# Patient Record
Sex: Female | Born: 1980 | Race: White | Hispanic: Yes
Health system: Southern US, Community
[De-identification: ages and names within clinical notes are randomized; demographics above are authoritative.]

---

## 2007-06-14 ENCOUNTER — Ambulatory Visit (HOSPITAL_COMMUNITY): Admission: RE | Admit: 2007-06-14 | Discharge: 2007-06-14 | Payer: Self-pay | Admitting: Obstetrics and Gynecology

## 2007-07-13 ENCOUNTER — Ambulatory Visit (HOSPITAL_COMMUNITY): Admission: RE | Admit: 2007-07-13 | Discharge: 2007-07-13 | Payer: Self-pay | Admitting: Obstetrics and Gynecology

## 2007-08-09 ENCOUNTER — Ambulatory Visit (HOSPITAL_COMMUNITY): Admission: RE | Admit: 2007-08-09 | Discharge: 2007-08-09 | Payer: Self-pay | Admitting: Obstetrics and Gynecology

## 2007-08-24 ENCOUNTER — Ambulatory Visit (HOSPITAL_COMMUNITY): Admission: RE | Admit: 2007-08-24 | Discharge: 2007-08-24 | Payer: Self-pay | Admitting: Obstetrics and Gynecology

## 2007-09-07 ENCOUNTER — Ambulatory Visit (HOSPITAL_COMMUNITY): Admission: RE | Admit: 2007-09-07 | Discharge: 2007-09-07 | Payer: Self-pay | Admitting: Obstetrics and Gynecology

## 2007-09-26 ENCOUNTER — Ambulatory Visit (HOSPITAL_COMMUNITY): Admission: RE | Admit: 2007-09-26 | Discharge: 2007-09-26 | Payer: Self-pay | Admitting: Obstetrics and Gynecology

## 2007-10-04 ENCOUNTER — Ambulatory Visit (HOSPITAL_COMMUNITY): Admission: RE | Admit: 2007-10-04 | Discharge: 2007-10-04 | Payer: Self-pay | Admitting: Obstetrics and Gynecology

## 2007-10-13 ENCOUNTER — Ambulatory Visit (HOSPITAL_COMMUNITY): Admission: RE | Admit: 2007-10-13 | Discharge: 2007-10-13 | Payer: Self-pay | Admitting: Obstetrics and Gynecology

## 2008-04-25 ENCOUNTER — Ambulatory Visit: Payer: Self-pay | Admitting: Family Medicine

## 2008-04-25 LAB — CONVERTED CEMR LAB
Basophils Absolute: 0 10*3/uL (ref 0.0–0.1)
Eosinophils Absolute: 0.1 10*3/uL (ref 0.0–0.7)
Eosinophils Relative: 1 % (ref 0–5)
HCT: 36.7 % (ref 36.0–46.0)
Hemoglobin: 12.2 g/dL (ref 12.0–15.0)
Hepatitis B Surface Ag: NEGATIVE
Hgb A2 Quant: 2.9 % (ref 2.2–3.2)
Hgb A: 97.1 % (ref 96.8–97.8)
Lymphocytes Relative: 26 % (ref 12–46)
Lymphs Abs: 2.2 10*3/uL (ref 0.7–4.0)
MCV: 85.9 fL (ref 78.0–100.0)
Monocytes Absolute: 0.5 10*3/uL (ref 0.1–1.0)
Platelets: 386 10*3/uL (ref 150–400)
RDW: 14.5 % (ref 11.5–15.5)
Rh Type: POSITIVE

## 2008-04-29 ENCOUNTER — Encounter: Payer: Self-pay | Admitting: Family Medicine

## 2008-04-29 ENCOUNTER — Encounter: Payer: Self-pay | Admitting: Obstetrics & Gynecology

## 2008-04-29 ENCOUNTER — Ambulatory Visit: Payer: Self-pay | Admitting: Obstetrics & Gynecology

## 2008-04-29 ENCOUNTER — Ambulatory Visit (HOSPITAL_COMMUNITY): Admission: RE | Admit: 2008-04-29 | Discharge: 2008-04-29 | Payer: Self-pay | Admitting: Family Medicine

## 2008-04-29 LAB — CONVERTED CEMR LAB
Trich, Wet Prep: NONE SEEN
Yeast Wet Prep HPF POC: NONE SEEN

## 2008-05-06 ENCOUNTER — Ambulatory Visit (HOSPITAL_COMMUNITY): Admission: RE | Admit: 2008-05-06 | Discharge: 2008-05-06 | Payer: Self-pay | Admitting: Obstetrics & Gynecology

## 2008-05-06 ENCOUNTER — Ambulatory Visit: Payer: Self-pay | Admitting: Obstetrics & Gynecology

## 2008-05-16 ENCOUNTER — Ambulatory Visit: Payer: Self-pay | Admitting: Obstetrics & Gynecology

## 2008-05-23 ENCOUNTER — Ambulatory Visit: Payer: Self-pay | Admitting: Family Medicine

## 2008-05-30 ENCOUNTER — Ambulatory Visit: Payer: Self-pay | Admitting: Family Medicine

## 2008-06-07 ENCOUNTER — Ambulatory Visit: Payer: Self-pay | Admitting: Obstetrics & Gynecology

## 2008-06-13 ENCOUNTER — Ambulatory Visit (HOSPITAL_COMMUNITY): Admission: RE | Admit: 2008-06-13 | Discharge: 2008-06-13 | Payer: Self-pay | Admitting: Family Medicine

## 2008-06-13 ENCOUNTER — Ambulatory Visit: Payer: Self-pay | Admitting: Obstetrics & Gynecology

## 2008-06-21 ENCOUNTER — Ambulatory Visit: Payer: Self-pay | Admitting: Obstetrics & Gynecology

## 2008-06-28 ENCOUNTER — Ambulatory Visit: Payer: Self-pay | Admitting: Obstetrics & Gynecology

## 2008-06-29 ENCOUNTER — Encounter: Payer: Self-pay | Admitting: Family Medicine

## 2008-07-03 ENCOUNTER — Ambulatory Visit: Payer: Self-pay | Admitting: Physician Assistant

## 2008-07-03 ENCOUNTER — Inpatient Hospital Stay (HOSPITAL_COMMUNITY): Admission: AD | Admit: 2008-07-03 | Discharge: 2008-07-03 | Payer: Self-pay | Admitting: Obstetrics & Gynecology

## 2008-07-05 ENCOUNTER — Ambulatory Visit: Payer: Self-pay | Admitting: Obstetrics & Gynecology

## 2008-07-11 ENCOUNTER — Ambulatory Visit: Payer: Self-pay | Admitting: Obstetrics & Gynecology

## 2008-07-11 ENCOUNTER — Encounter: Payer: Self-pay | Admitting: Family Medicine

## 2008-07-19 ENCOUNTER — Ambulatory Visit: Payer: Self-pay | Admitting: Obstetrics & Gynecology

## 2008-07-25 ENCOUNTER — Ambulatory Visit: Payer: Self-pay | Admitting: Family Medicine

## 2008-07-25 ENCOUNTER — Encounter: Payer: Self-pay | Admitting: Family Medicine

## 2008-08-01 ENCOUNTER — Encounter: Payer: Self-pay | Admitting: Obstetrics & Gynecology

## 2008-08-01 ENCOUNTER — Ambulatory Visit: Payer: Self-pay | Admitting: Family Medicine

## 2008-08-08 ENCOUNTER — Encounter: Payer: Self-pay | Admitting: Obstetrics & Gynecology

## 2008-08-08 ENCOUNTER — Ambulatory Visit: Payer: Self-pay | Admitting: Family Medicine

## 2008-08-08 LAB — CONVERTED CEMR LAB
HCT: 33.6 % — ABNORMAL LOW (ref 36.0–46.0)
Hemoglobin: 11.5 g/dL — ABNORMAL LOW (ref 12.0–15.0)
MCHC: 34.2 g/dL (ref 30.0–36.0)
MCV: 82.6 fL (ref 78.0–100.0)
RBC: 4.07 M/uL (ref 3.87–5.11)
RDW: 13.6 % (ref 11.5–15.5)

## 2008-08-15 ENCOUNTER — Ambulatory Visit: Payer: Self-pay | Admitting: Obstetrics & Gynecology

## 2008-08-15 LAB — CONVERTED CEMR LAB

## 2008-08-24 ENCOUNTER — Inpatient Hospital Stay (HOSPITAL_COMMUNITY): Admission: AD | Admit: 2008-08-24 | Discharge: 2008-09-02 | Payer: Self-pay | Admitting: Family Medicine

## 2008-08-24 ENCOUNTER — Ambulatory Visit: Payer: Self-pay | Admitting: Obstetrics and Gynecology

## 2008-08-31 ENCOUNTER — Encounter: Payer: Self-pay | Admitting: Family Medicine

## 2009-03-21 ENCOUNTER — Emergency Department (HOSPITAL_COMMUNITY): Admission: EM | Admit: 2009-03-21 | Discharge: 2009-03-21 | Payer: Self-pay | Admitting: Emergency Medicine

## 2009-05-23 ENCOUNTER — Encounter: Admission: RE | Admit: 2009-05-23 | Discharge: 2009-05-23 | Payer: Self-pay | Admitting: Specialist

## 2009-12-15 ENCOUNTER — Inpatient Hospital Stay (HOSPITAL_COMMUNITY): Admission: AD | Admit: 2009-12-15 | Discharge: 2009-12-15 | Payer: Self-pay | Admitting: Obstetrics & Gynecology

## 2009-12-15 ENCOUNTER — Ambulatory Visit: Payer: Self-pay | Admitting: Obstetrics & Gynecology

## 2010-05-17 ENCOUNTER — Encounter: Payer: Self-pay | Admitting: Obstetrics and Gynecology

## 2010-05-18 ENCOUNTER — Encounter: Payer: Self-pay | Admitting: Obstetrics and Gynecology

## 2010-07-10 LAB — URINALYSIS, ROUTINE W REFLEX MICROSCOPIC
Bilirubin Urine: NEGATIVE
Leukocytes, UA: NEGATIVE
Nitrite: NEGATIVE
Specific Gravity, Urine: >1.03 — ABNORMAL HIGH (ref 1.005–1.030)
Urobilinogen, UA: 0.2 mg/dL (ref 0.0–1.0)
pH: 6 (ref 5.0–8.0)

## 2010-07-10 LAB — WET PREP, GENITAL: Trich, Wet Prep: NONE SEEN

## 2010-07-10 LAB — GC/CHLAMYDIA PROBE AMP, GENITAL: GC Probe Amp, Genital: NEGATIVE

## 2010-07-10 LAB — CBC
HCT: 33.9 % — ABNORMAL LOW (ref 36.0–46.0)
MCH: 29.3 pg (ref 26.0–34.0)
MCV: 86.5 fL (ref 78.0–100.0)
RDW: 13.8 % (ref 11.5–15.5)
WBC: 13.1 10*3/uL — ABNORMAL HIGH (ref 4.0–10.5)

## 2010-07-10 LAB — URINE MICROSCOPIC-ADD ON

## 2010-07-29 LAB — URINALYSIS, ROUTINE W REFLEX MICROSCOPIC
Bilirubin Urine: NEGATIVE
Glucose, UA: NEGATIVE mg/dL
Ketones, ur: NEGATIVE mg/dL
Leukocytes, UA: NEGATIVE
pH: 6 (ref 5.0–8.0)

## 2010-07-29 LAB — CBC
MCHC: 33.4 g/dL (ref 30.0–36.0)
MCV: 82.7 fL (ref 78.0–100.0)
Platelets: 412 10*3/uL — ABNORMAL HIGH (ref 150–400)
WBC: 10.9 10*3/uL — ABNORMAL HIGH (ref 4.0–10.5)

## 2010-07-29 LAB — URINE MICROSCOPIC-ADD ON

## 2010-07-29 LAB — COMPREHENSIVE METABOLIC PANEL
AST: 15 U/L (ref 0–37)
Albumin: 4.3 g/dL (ref 3.5–5.2)
Calcium: 9.6 mg/dL (ref 8.4–10.5)
Chloride: 112 mEq/L (ref 96–112)
Creatinine, Ser: 0.61 mg/dL (ref 0.4–1.2)
GFR calc Af Amer: >60 mL/min (ref >60)

## 2010-07-29 LAB — RAPID URINE DRUG SCREEN, HOSP PERFORMED
Cocaine: NOT DETECTED
Opiates: NOT DETECTED

## 2010-07-29 LAB — DIFFERENTIAL
Eosinophils Relative: 1 % (ref 0–5)
Lymphocytes Relative: 30 % (ref 12–46)
Lymphs Abs: 3.3 10*3/uL (ref 0.7–4.0)
Monocytes Absolute: 0.6 10*3/uL (ref 0.1–1.0)

## 2010-07-29 LAB — POCT PREGNANCY, URINE: Preg Test, Ur: NEGATIVE

## 2010-08-04 LAB — TYPE AND SCREEN: Antibody Screen: NEGATIVE

## 2010-08-04 LAB — CBC
HCT: 30.4 % — ABNORMAL LOW (ref 36.0–46.0)
HCT: 31.7 % — ABNORMAL LOW (ref 36.0–46.0)
Hemoglobin: 10.6 g/dL — ABNORMAL LOW (ref 12.0–15.0)
Hemoglobin: 11.1 g/dL — ABNORMAL LOW (ref 12.0–15.0)
Hemoglobin: 12 g/dL (ref 12.0–15.0)
Hemoglobin: 12.3 g/dL (ref 12.0–15.0)
MCHC: 34.2 g/dL (ref 30.0–36.0)
MCV: 87 fL (ref 78.0–100.0)
MCV: 88.1 fL (ref 78.0–100.0)
Platelets: 289 10*3/uL (ref 150–400)
Platelets: 292 10*3/uL (ref 150–400)
Platelets: 293 10*3/uL (ref 150–400)
RBC: 3.57 MIL/uL — ABNORMAL LOW (ref 3.87–5.11)
RBC: 3.98 MIL/uL (ref 3.87–5.11)
RBC: 4.08 MIL/uL (ref 3.87–5.11)
RDW: 13.2 % (ref 11.5–15.5)
WBC: 16 10*3/uL — ABNORMAL HIGH (ref 4.0–10.5)
WBC: 19 10*3/uL — ABNORMAL HIGH (ref 4.0–10.5)
WBC: 20.2 10*3/uL — ABNORMAL HIGH (ref 4.0–10.5)
WBC: 23.8 10*3/uL — ABNORMAL HIGH (ref 4.0–10.5)

## 2010-08-04 LAB — URINALYSIS, ROUTINE W REFLEX MICROSCOPIC
Bilirubin Urine: NEGATIVE
Glucose, UA: NEGATIVE mg/dL
Specific Gravity, Urine: 1.02 (ref 1.005–1.030)
Urobilinogen, UA: 0.2 mg/dL (ref 0.0–1.0)
pH: 6 (ref 5.0–8.0)

## 2010-08-04 LAB — DIFFERENTIAL
Eosinophils Relative: 1 % (ref 0–5)
Lymphocytes Relative: 15 % (ref 12–46)
Lymphs Abs: 1.8 10*3/uL (ref 0.7–4.0)
Lymphs Abs: 2.5 10*3/uL (ref 0.7–4.0)
Monocytes Relative: 5 % (ref 3–12)
Neutro Abs: 19.9 10*3/uL — ABNORMAL HIGH (ref 1.7–7.7)
Neutrophils Relative %: 87 % — ABNORMAL HIGH (ref 43–77)

## 2010-08-04 LAB — ABO/RH: ABO/RH(D): O POS

## 2010-08-04 LAB — WET PREP, GENITAL
Clue Cells Wet Prep HPF POC: NONE SEEN
Trich, Wet Prep: NONE SEEN

## 2010-08-04 LAB — URINE MICROSCOPIC-ADD ON

## 2010-08-05 LAB — POCT URINALYSIS DIP (DEVICE)
Bilirubin Urine: NEGATIVE
Bilirubin Urine: NEGATIVE
Glucose, UA: NEGATIVE mg/dL
Ketones, ur: NEGATIVE mg/dL
Nitrite: NEGATIVE
Nitrite: NEGATIVE
Protein, ur: 30 mg/dL — AB
Protein, ur: NEGATIVE mg/dL
Protein, ur: NEGATIVE mg/dL
Specific Gravity, Urine: 1.025 (ref 1.005–1.030)
Specific Gravity, Urine: 1.015 (ref 1.005–1.030)
Specific Gravity, Urine: 1.025 (ref 1.005–1.030)
Urobilinogen, UA: 0.2 mg/dL (ref 0.0–1.0)
Urobilinogen, UA: 0.2 mg/dL (ref 0.0–1.0)
Urobilinogen, UA: 1 mg/dL (ref 0.0–1.0)
pH: 6 (ref 5.0–8.0)
pH: 6.5 (ref 5.0–8.0)
pH: 6.5 (ref 5.0–8.0)
pH: 6.5 (ref 5.0–8.0)

## 2010-08-06 LAB — URINE MICROSCOPIC-ADD ON

## 2010-08-06 LAB — POCT URINALYSIS DIP (DEVICE)
Ketones, ur: NEGATIVE mg/dL
Nitrite: NEGATIVE
Nitrite: NEGATIVE
Protein, ur: NEGATIVE mg/dL
Protein, ur: NEGATIVE mg/dL
Urobilinogen, UA: 0.2 mg/dL (ref 0.0–1.0)
Urobilinogen, UA: 0.2 mg/dL (ref 0.0–1.0)
pH: 6.5 (ref 5.0–8.0)
pH: 7 (ref 5.0–8.0)

## 2010-08-06 LAB — URINE CULTURE: Colony Count: NO GROWTH

## 2010-08-06 LAB — URINALYSIS, ROUTINE W REFLEX MICROSCOPIC
Bilirubin Urine: NEGATIVE
Ketones, ur: NEGATIVE mg/dL
Nitrite: NEGATIVE
Protein, ur: NEGATIVE mg/dL
pH: 7.5 (ref 5.0–8.0)

## 2010-08-06 LAB — WET PREP, GENITAL
Trich, Wet Prep: NONE SEEN
Yeast Wet Prep HPF POC: NONE SEEN

## 2010-08-06 LAB — GC/CHLAMYDIA PROBE AMP, GENITAL: Chlamydia, DNA Probe: NEGATIVE

## 2010-08-10 LAB — CBC
HCT: 35.7 % — ABNORMAL LOW (ref 36.0–46.0)
Hemoglobin: 11.9 g/dL — ABNORMAL LOW (ref 12.0–15.0)
WBC: 9.2 10*3/uL (ref 4.0–10.5)

## 2010-08-10 LAB — POCT URINALYSIS DIP (DEVICE)
Bilirubin Urine: NEGATIVE
Ketones, ur: NEGATIVE mg/dL
Ketones, ur: NEGATIVE mg/dL
Protein, ur: NEGATIVE mg/dL
Specific Gravity, Urine: 1.025 (ref 1.005–1.030)
Specific Gravity, Urine: 1.02 (ref 1.005–1.030)
pH: 6 (ref 5.0–8.0)

## 2010-08-11 LAB — POCT URINALYSIS DIP (DEVICE)
Bilirubin Urine: NEGATIVE
Nitrite: NEGATIVE
Protein, ur: NEGATIVE mg/dL
Protein, ur: NEGATIVE mg/dL
Specific Gravity, Urine: 1.02 (ref 1.005–1.030)
Urobilinogen, UA: 0.2 mg/dL (ref 0.0–1.0)
Urobilinogen, UA: 0.2 mg/dL (ref 0.0–1.0)
pH: 6 (ref 5.0–8.0)
pH: 6 (ref 5.0–8.0)

## 2010-09-08 NOTE — Op Note (Signed)
[name], Cathy [name]          ACCOUNT NO.:  [id number]   MEDICAL RECORD NO.:  [id number]          PATIENT TYPE:  AMB   LOCATION:  SDC                           FACILITY:  [facility]   PHYSICIAN:  Norton Blizzard, MD    DATE OF BIRTH:  [birth year]   DATE OF PROCEDURE:  05/06/2008  DATE OF DISCHARGE:                               OPERATIVE REPORT   PREOPERATIVE DIAGNOSES:  Intrauterine pregnancy at 14-2/7th weeks  gestation, history of cervical incompetence, prior cervical cerclage x2.   POSTOPERATIVE DIAGNOSES:  Intrauterine pregnancy at 14-2/7th weeks  gestation, history of cervical incompetence, prior cervical cerclage x2.   PROCEDURE:  Vaginal cerclage placement using [employer] method.   SURGEON:  Norton Blizzard, MD   ANESTHESIA:  Spinal.   INTRAVENOUS FLUIDS:  1 L of lactated Ringer's.   ESTIMATED BLOOD LOSS:  Minimal.   INDICATIONS:  The patient is a 30 year old gravida 63, para 2-1-8-3, at  14-2/7th weeks who has a history of cervical incompetence.  The patient  desires cerclage placement.  The risks of surgery including but not  limited to bleeding, infection, injury to surrounding organs, injury to  amniotic cavity leading to rupture of membranes or preterm  labor/delivery, need for additional procedures were discussed with the  patient, and written informed consent was obtained.   FINDINGS:  The patient had a shortened cervix was noted transvaginally;  about one centimeter in length was present transvaginally, and  cervicovaginal junction were pretty much flush with the surrouding  vagina and was hard to demarcate.  Otherwise, normal vagina, normal  external female genitalia, and a gravid uterus measuring appropriately.   SPECIMENS:  None.   COMPLICATIONS:  None immediately after procedure.   PROCEDURE DETAILS:  The patient received preoperative IV antibiotics.  She was then taken to the operating room where spinal analgesia was  administered and found to be  adequate.  The patient was then placed in  the dorsal lithotomy position and prepped and draped in a sterile  manner.  Her bladder was catheterized for an unmeasured amount of clear  yellow urine.  Attention was turned to the patient's vagina where a  speculum was placed and her cervix was grasped with the ring forceps.  Cervix was noted to be very short, but I was able to demarcate the  cervicovaginal junction circumferentially.  A 5-mm Mersilene stitch was  placed in a pursestring fashion around the cervix with care given to  avoid the vessels at the 3 o'clock and 9 o'clock positions.  The stitch  was tied anteriorly and  good closure of the cervical os was noted.  There was minimal bleeding  at the end of the case.  The patient tolerated the procedure well.  Sponge, instrument, and needle counts were correct x2.  She was taken to  the recovery room in stable condition.      Norton Blizzard, MD  Electronically Signed     UAD/MEDQ  D:  05/06/2008  T:  05/07/2008  Job:  [id number]

## 2010-09-08 NOTE — Discharge Summary (Signed)
[name], [name]          ACCOUNT NO.:  [id number]   MEDICAL RECORD NO.:  [id number]          PATIENT TYPE:  INP   LOCATION:  9320                          FACILITY:  [facility]   PHYSICIAN:  Tilda Burrow, M.D. DATE OF BIRTH:  Nov 17, 1980   DATE OF ADMISSION:  08/24/2008  DATE OF DISCHARGE:  09/02/2008                               DISCHARGE SUMMARY   DISCHARGE DIAGNOSES:  1. Intrauterine pregnancy at 31 weeks.  2. Incompetent cervix with cerclage.  3. Preterm premature rupture of membranes.  4. Chorioamnionitis.  5. Successful vaginal birth after cesarean.   BRIEF HOSPITAL COURSE:  Cathy [name] is a 30 year old, G11, P2-3-5-  3, who was admitted at 11.1 weeks' gestation for preterm premature  rupture of membranes.  The patient had a cerclage in place as she has a  history of incompetent cervix with several spontaneous abortions and an  IUFD at 21 weeks in 2005.  She was treated with ampicillin and  erythromycin as well as routine expectant management for a preterm  labor.  On Aug 31, 2008, she was found to have signs of chorioamnionitis,  so her cerclage was removed, and she did quickly deliver a viable female  with Apgars 8 and 9 via normal spontaneous vaginal delivery.  This was  considered a successful VBAC in this patient.  The patient was continued  to continue treatment with antibiotics throughout the rest of her stay  which was otherwise uncomplicated.  She was discharged home on postop  day #2 with routine medications and instructions to follow up with her  primary OB in 2 weeks.      Helane Rima, MD      Tilda Burrow, M.D.  Electronically Signed    EW/MEDQ  D:  10/29/2008  T:  10/29/2008  Job:  [id number]

## 2011-01-29 LAB — POCT URINALYSIS DIP (DEVICE)
Ketones, ur: NEGATIVE mg/dL
Protein, ur: NEGATIVE mg/dL
Specific Gravity, Urine: 1.025 (ref 1.005–1.030)
pH: 5 (ref 5.0–8.0)

## 2011-02-11 ENCOUNTER — Other Ambulatory Visit: Payer: Self-pay | Admitting: Specialist

## 2011-02-11 DIAGNOSIS — R102 Pelvic and perineal pain: Secondary | ICD-10-CM

## 2011-02-15 ENCOUNTER — Ambulatory Visit
Admission: RE | Admit: 2011-02-15 | Discharge: 2011-02-15 | Disposition: A | Discharge status: Home / Self Care | Payer: Medicaid Other | Source: Ambulatory Visit | Attending: Specialist | Admitting: Specialist

## 2011-02-15 DIAGNOSIS — R102 Pelvic and perineal pain: Secondary | ICD-10-CM

## 2012-05-07 IMAGING — US US PELVIS COMPLETE
1 series · 13 of 25 positions shown · non-contrast
Comparison: None.

CLINICAL DATA: Left lower quadrant pelvic pain.  Confirm placement
of IUD.

TRANSABDOMINAL AND TRANSVAGINAL ULTRASOUND OF PELVIS
TECHNIQUE: Both transabdominal and transvaginal ultrasound
examinations of the pelvis were performed. Transabdominal technique
was performed for global imaging of the pelvis including uterus,
ovaries, adnexal regions, and pelvic cul-de-sac.

[Series 1: us pelvis complete · 0.22mm/px · 13 of 71 slices shown]
[im 1/71]
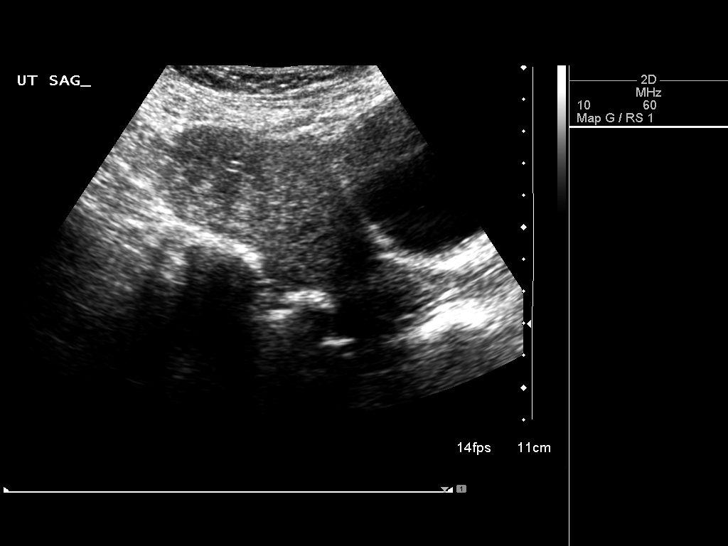
[im 6/71]
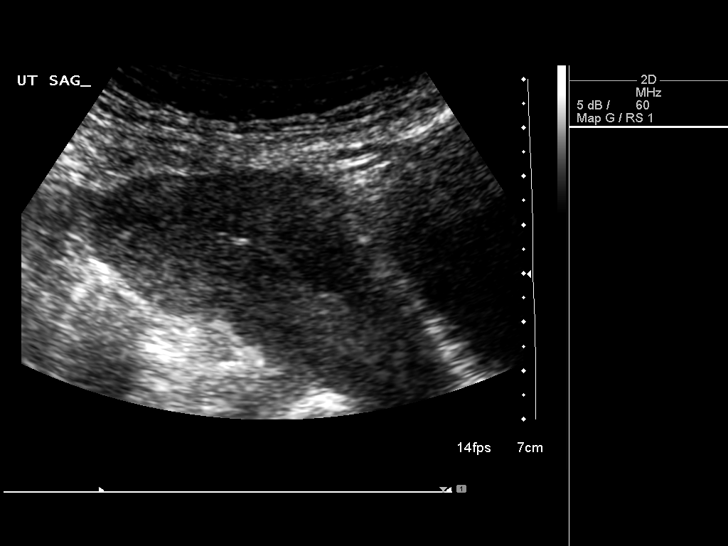
[im 12/71]
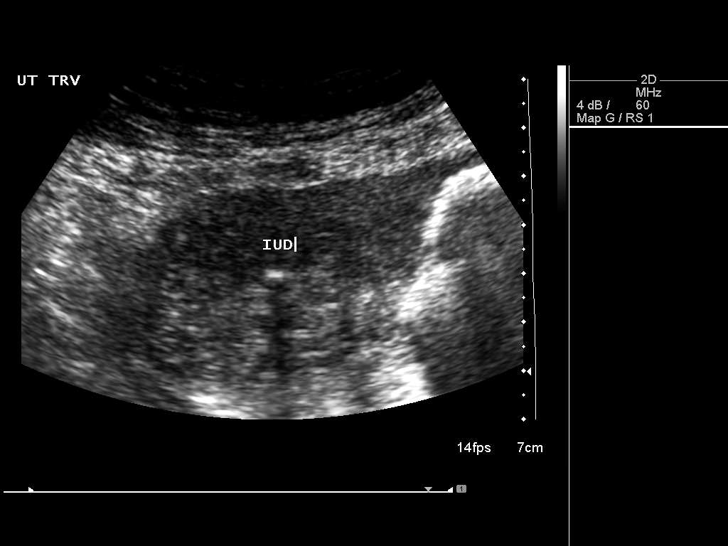
[im 18/71]
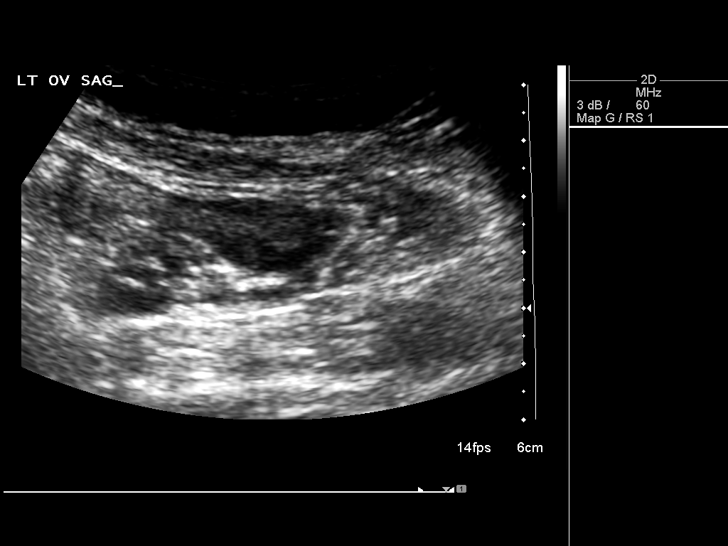
[im 24/71]
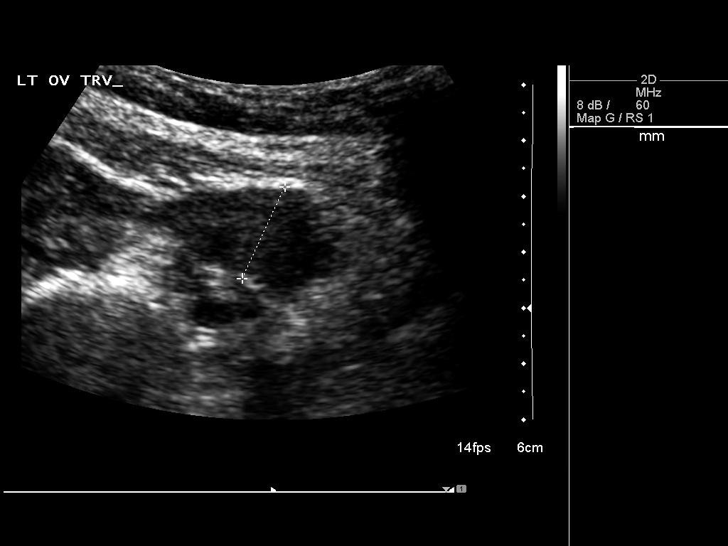
[im 30/71]
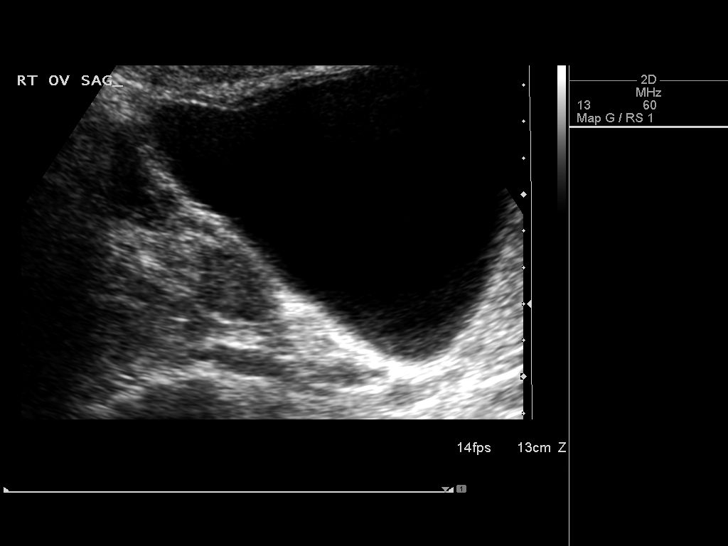
[im 36/71]
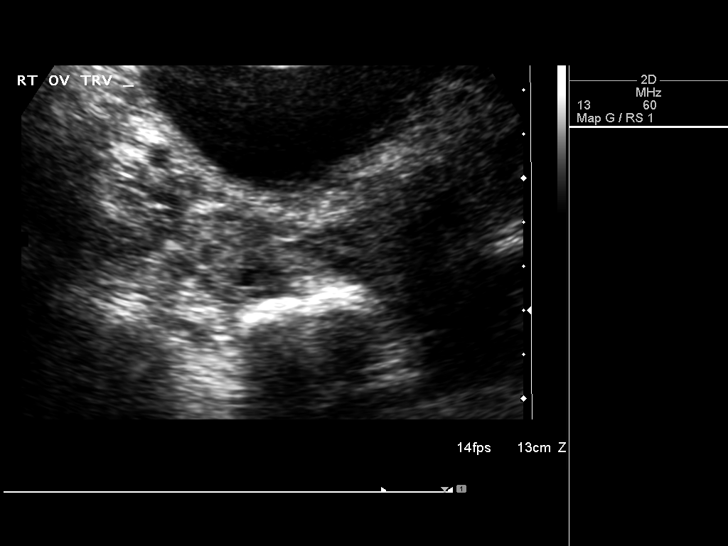
[im 41/71]
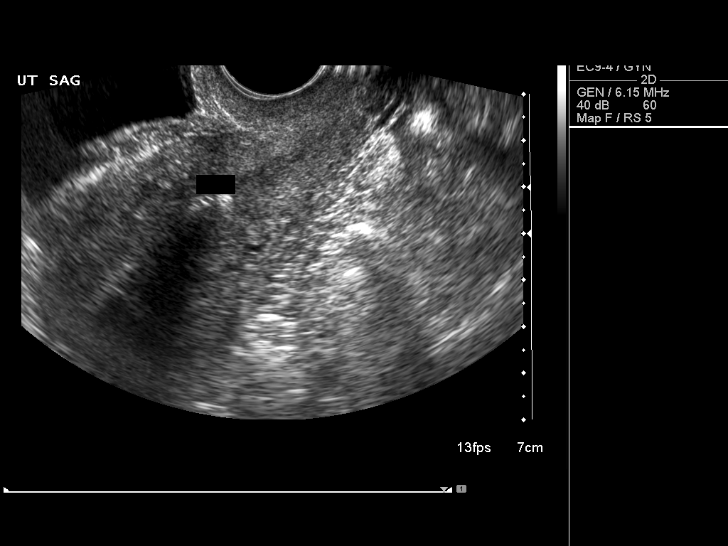
[im 47/71]
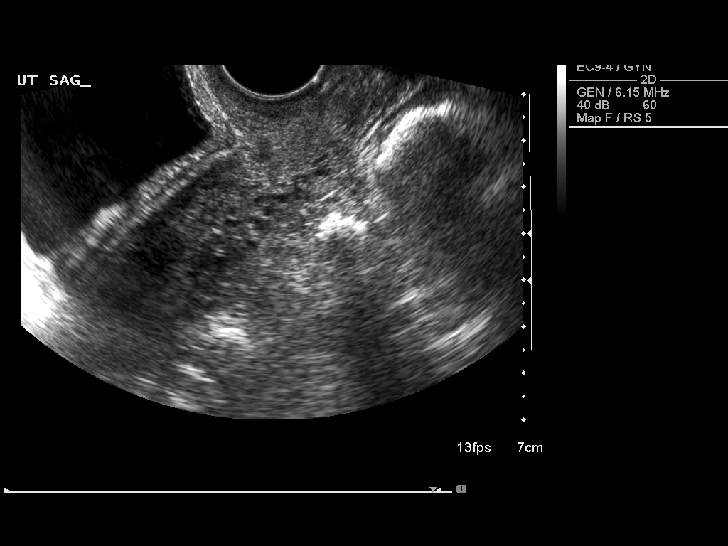
[im 53/71]
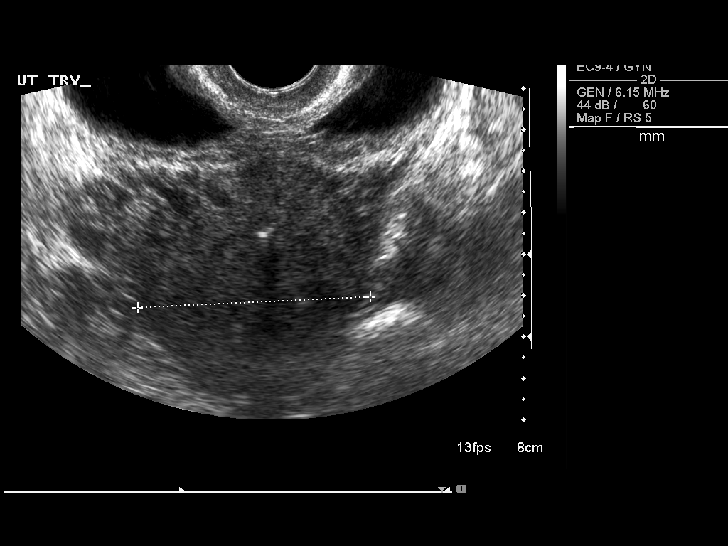
[im 59/71]
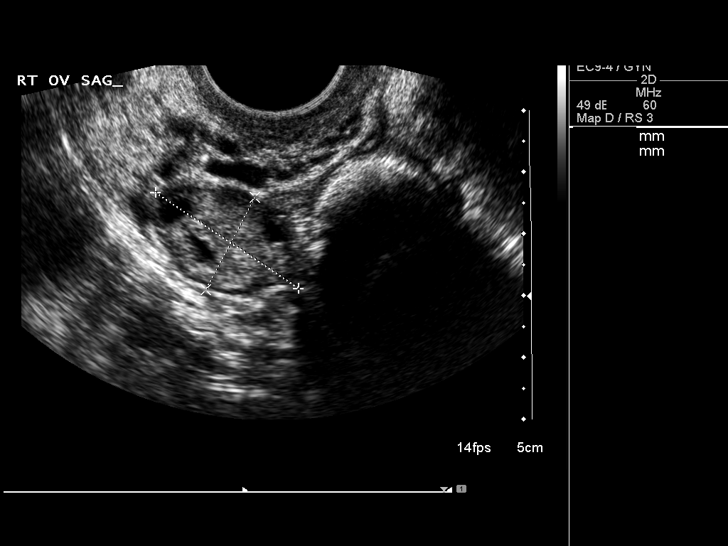
[im 65/71]
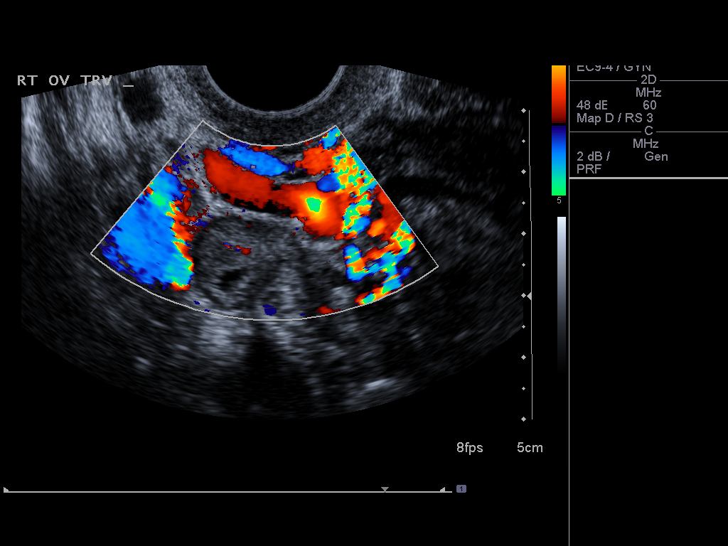
[im 71/71]
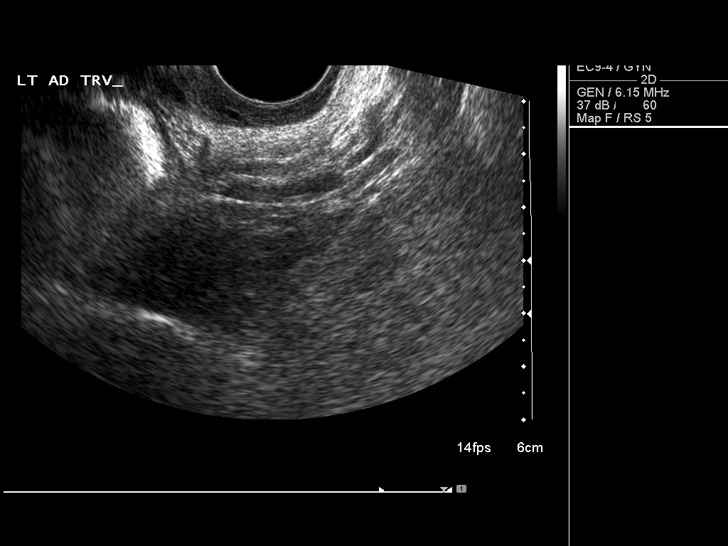

[13 of 25 positions shown; findings below may reference images not displayed]

It was necessary to proceed with endovaginal exam following the
transabdominal exam to visualize the endometrium, ovaries and
adnexal regions.
FINDINGS: Uterus: Measures 8.9 x 3.8 x 5.4 cm with uniform parenchymal echo
texture.

Endometrium: Measures 9 mm.  The intrauterine contraceptive device
is suboptimally visualized, possibly due to patient discomfort
while scanning.  Portions are seen in the uterine body.  The two
arms of the IUD may be within the uterine fundus, but are not seen
in their entirety.

Right ovary:  Measures 2.8 x 1.7 x 1.8 cm, negative.

Left ovary: Measures 3.8 x 1.9 x 1.8 cm, negative.

Other findings: Small free fluid.
IMPRESSION: 1.  Intrauterine contraceptive device is partially visualized.
Difficult to confirm the fundal position of the two arms of the
IUD.
2.  Small free fluid.

## 2013-07-23 ENCOUNTER — Other Ambulatory Visit: Payer: Self-pay

## 2013-08-16 ENCOUNTER — Other Ambulatory Visit (HOSPITAL_COMMUNITY): Payer: Self-pay | Admitting: Obstetrics and Gynecology

## 2013-08-16 DIAGNOSIS — N883 Incompetence of cervix uteri: Secondary | ICD-10-CM

## 2013-08-17 ENCOUNTER — Ambulatory Visit (HOSPITAL_COMMUNITY)
Admission: RE | Admit: 2013-08-17 | Discharge: 2013-08-17 | Disposition: A | Discharge status: Home / Self Care | Payer: Medicaid Other | Source: Ambulatory Visit | Attending: Obstetrics and Gynecology | Admitting: Obstetrics and Gynecology

## 2013-08-17 ENCOUNTER — Encounter (HOSPITAL_COMMUNITY): Payer: Medicaid Other

## 2013-08-21 ENCOUNTER — Other Ambulatory Visit (HOSPITAL_COMMUNITY): Payer: Self-pay | Admitting: Obstetrics and Gynecology

## 2013-08-21 ENCOUNTER — Ambulatory Visit (HOSPITAL_COMMUNITY)
Admission: RE | Admit: 2013-08-21 | Discharge: 2013-08-21 | Disposition: A | Discharge status: Home / Self Care | Payer: Medicaid Other | Source: Ambulatory Visit | Attending: Obstetrics and Gynecology | Admitting: Obstetrics and Gynecology

## 2013-08-21 ENCOUNTER — Encounter (HOSPITAL_COMMUNITY): Payer: Self-pay

## 2013-08-21 DIAGNOSIS — O3430 Maternal care for cervical incompetence, unspecified trimester: Secondary | ICD-10-CM | POA: Insufficient documentation

## 2013-08-21 DIAGNOSIS — N883 Incompetence of cervix uteri: Secondary | ICD-10-CM

## 2013-08-21 DIAGNOSIS — Z3689 Encounter for other specified antenatal screening: Secondary | ICD-10-CM | POA: Insufficient documentation

## 2014-02-25 ENCOUNTER — Encounter (HOSPITAL_COMMUNITY): Payer: Self-pay

## 2014-06-26 ENCOUNTER — Encounter (HOSPITAL_COMMUNITY): Payer: Self-pay | Admitting: *Deleted
